# Patient Record
Sex: Female | Born: 1965 | Race: White | Hispanic: No | Marital: Single | Smoking: Former smoker
Health system: Southern US, Community
[De-identification: ages and names within clinical notes are randomized; demographics above are authoritative.]

## PROBLEM LIST (undated history)

## (undated) DIAGNOSIS — N950 Postmenopausal bleeding: Secondary | ICD-10-CM

## (undated) DIAGNOSIS — Z8719 Personal history of other diseases of the digestive system: Secondary | ICD-10-CM

## (undated) DIAGNOSIS — E669 Obesity, unspecified: Secondary | ICD-10-CM

## (undated) DIAGNOSIS — I10 Essential (primary) hypertension: Secondary | ICD-10-CM

## (undated) DIAGNOSIS — E119 Type 2 diabetes mellitus without complications: Secondary | ICD-10-CM

## (undated) DIAGNOSIS — E782 Mixed hyperlipidemia: Secondary | ICD-10-CM

## (undated) DIAGNOSIS — G43909 Migraine, unspecified, not intractable, without status migrainosus: Secondary | ICD-10-CM

---

## 1996-12-23 HISTORY — PX: CHOLECYSTECTOMY: SHX55

## 2004-12-23 HISTORY — PX: FRACTURE SURGERY: SHX138

## 2016-08-12 ENCOUNTER — Emergency Department
Admission: EM | Admit: 2016-08-12 | Discharge: 2016-08-12 | Disposition: A | Discharge status: Home / Self Care | Payer: Self-pay | Attending: Emergency Medicine | Admitting: Emergency Medicine

## 2016-08-12 ENCOUNTER — Emergency Department: Payer: Self-pay

## 2016-08-12 DIAGNOSIS — S61210A Laceration without foreign body of right index finger without damage to nail, initial encounter: Secondary | ICD-10-CM | POA: Insufficient documentation

## 2016-08-12 DIAGNOSIS — Z23 Encounter for immunization: Secondary | ICD-10-CM | POA: Insufficient documentation

## 2016-08-12 DIAGNOSIS — Y9389 Activity, other specified: Secondary | ICD-10-CM | POA: Insufficient documentation

## 2016-08-12 DIAGNOSIS — Y999 Unspecified external cause status: Secondary | ICD-10-CM | POA: Insufficient documentation

## 2016-08-12 DIAGNOSIS — W28XXXA Contact with powered lawn mower, initial encounter: Secondary | ICD-10-CM | POA: Insufficient documentation

## 2016-08-12 DIAGNOSIS — Y929 Unspecified place or not applicable: Secondary | ICD-10-CM | POA: Insufficient documentation

## 2016-08-12 DIAGNOSIS — S61219A Laceration without foreign body of unspecified finger without damage to nail, initial encounter: Secondary | ICD-10-CM

## 2016-08-12 MED ORDER — LIDOCAINE HCL (PF) 1 % IJ SOLN
INTRAMUSCULAR | Status: AC
  Administered 2016-08-12: 21:00:00
  Filled 2016-08-12: qty 5

## 2016-08-12 MED ORDER — BACITRACIN ZINC 500 UNIT/GM EX OINT
TOPICAL_OINTMENT | CUTANEOUS | Status: AC
  Filled 2016-08-12: qty 0.9

## 2016-08-12 MED ORDER — ONDANSETRON 8 MG PO TBDP
8.0000 mg | ORAL_TABLET | Freq: Once | ORAL | Status: AC
  Administered 2016-08-12: 8 mg via ORAL

## 2016-08-12 MED ORDER — TETANUS-DIPHTH-ACELL PERTUSSIS 5-2.5-18.5 LF-MCG/0.5 IM SUSP
0.5000 mL | Freq: Once | INTRAMUSCULAR | Status: AC
  Administered 2016-08-12: 0.5 mL via INTRAMUSCULAR
  Filled 2016-08-12: qty 0.5

## 2016-08-12 MED ORDER — OXYCODONE-ACETAMINOPHEN 5-325 MG PO TABS
2.0000 | ORAL_TABLET | Freq: Once | ORAL | Status: AC
  Administered 2016-08-12: 2 via ORAL
  Filled 2016-08-12: qty 2

## 2016-08-12 MED ORDER — ONDANSETRON 8 MG PO TBDP
ORAL_TABLET | ORAL | Status: AC
  Filled 2016-08-12: qty 1

## 2016-08-12 MED ORDER — OXYCODONE-ACETAMINOPHEN 7.5-325 MG PO TABS: 1.0000 | ORAL_TABLET | ORAL | 0 refills | Status: AC | PRN

## 2016-08-12 MED ORDER — BACITRACIN ZINC 500 UNIT/GM EX OINT
TOPICAL_OINTMENT | Freq: Two times a day (BID) | CUTANEOUS | Status: DC
  Administered 2016-08-12: 22:00:00 via TOPICAL
  Administered 2016-08-12: 1 via TOPICAL

## 2016-08-12 NOTE — ED Triage Notes (Signed)
Patient ambulatory to triage with steady gait, without difficulty or distress noted; pt reports injuring right index finger after catching it on lawn mower

## 2016-08-12 NOTE — ED Provider Notes (Signed)
[facility] Emergency Department Provider Note   ____________________________________________   First MD Initiated Contact with Patient 08/12/16 2037     (approximate)  I have reviewed the triage vital signs and the nursing notes.   HISTORY  Chief Complaint Finger Injury    HPI Vanessa Henderson is a 50 y.o. female patient complaining of a laceration and contusion to the right index finger. Status post trying to start a lawnmower. Patient rating the pain as a 9/10. No palliative measures taken for this complaint. Patient right hand dominant.   No past medical history on file.  There are no active problems to display for this patient.   No past surgical history on file.  Prior to Admission medications   Medication Sig Start Date End Date Taking? Authorizing Provider  oxyCODONE-acetaminophen (PERCOCET) 7.5-325 MG tablet Take 1 tablet by mouth every 4 (four) hours as needed for severe pain. 08/12/16 08/12/17  Joni Reiningonald K Katheline Brendlinger, PA-C    Allergies Latex and Vicodin [hydrocodone-acetaminophen]  No family history on file.  Social History Social History  Substance Use Topics  . Smoking status: Not on file  . Smokeless tobacco: Not on file  . Alcohol use Not on file    Review of Systems Constitutional: No fever/chills Eyes: No visual changes. ENT: No sore throat. Cardiovascular: Denies chest pain. Respiratory: Denies shortness of breath. Gastrointestinal: No abdominal pain.  No nausea, no vomiting.  No diarrhea.  No constipation. Genitourinary: Negative for dysuria. Musculoskeletal:Right index finger pain Skin: Negative for rash. Laceration right index finger Neurological: Negative for headaches, focal weakness or numbness. Allergic/Immunilogical: Hydrocodone  ____________________________________________   PHYSICAL EXAM:  VITAL SIGNS: ED Triage Vitals  Enc Vitals Group     BP 08/12/16 2006 (!) 149/94     Pulse Rate 08/12/16 2006 90   Resp 08/12/16 2006 (!) 22     Temp 08/12/16 2006 97.9 F (36.6 C)     Temp Source 08/12/16 2006 Oral     SpO2 08/12/16 2006 98 %     Weight 08/12/16 2005 200 lb (90.7 kg)     Height 08/12/16 2005 5\' 3"  (1.6 m)     Head Circumference --      Peak Flow --      Pain Score 08/12/16 2005 10     Pain Loc --      Pain Edu? --      Excl. in GC? --     Constitutional: Alert and oriented. Well appearing and in no acute distress. Anxious Eyes: Conjunctivae are normal. PERRL. EOMI. Head: Atraumatic. Nose: No congestion/rhinnorhea. Mouth/Throat: Mucous membranes are moist.  Oropharynx non-erythematous. Neck: No stridor.  No cervical spine tenderness to palpation. Hematological/Lymphatic/Immunilogical: No cervical lymphadenopathy. Cardiovascular: Normal rate, regular rhythm. Grossly normal heart sounds.  Good peripheral circulation. Respiratory: Normal respiratory effort.  No retractions. Lungs CTAB. Gastrointestinal: Soft and nontender. No distention. No abdominal bruits. No CVA tenderness. Musculoskeletal: No lower extremity tenderness nor edema.  No joint effusions. Neurologic:  Normal speech and language. No gross focal neurologic deficits are appreciated. No gait instability. Skin: Edema and laceration to the distal phalange palmar and volar aspect of the right index finger  Psychiatric: Mood and affect are normal. Speech and behavior are normal.  ____________________________________________   LABS (all labs ordered are listed, but only abnormal results are displayed)  Labs Reviewed - No data to display ____________________________________________  EKG   ____________________________________________  RADIOLOGY  Radiologist states questionable minimal displaced tuft fracture. ____________________________________________   PROCEDURES  Procedure(s) performed: LACERATION REPAIR Performed by: Joni Reiningonald K Idella Lamontagne Authorized by: Joni Reiningonald K Siddiq Kaluzny Consent: Verbal consent obtained. Risks  and benefits: risks, benefits and alternatives were discussed Consent given by: patient Patient identity confirmed: provided demographic data Prepped and Draped in normal sterile fashion Wound explored  Laceration Location: Right index finger  Laceration Length: 1 cm  No Foreign Bodies seen or palpated  Anesthesia: Digital block Local anesthetic: Lidocaine 1% without epinephrine  Anesthetic total: 7 ml  Irrigation method: syringe Amount of cleaning: standard  Skin closure: 4-0 nylon   Number of sutures: 6 Technique: Interrupted Patient tolerance: Patient tolerated the procedure well with no immediate complications.   Procedures  Critical Care performed: No  ____________________________________________   INITIAL IMPRESSION / ASSESSMENT AND PLAN / ED COURSE  Pertinent labs & imaging results that were available during my care of the patient were reviewed by me and considered in my medical decision making (see chart for details).  Contusion/laceration to the right index finger. Patient given discharge care instructions. Patient patient given tetanus booster was in the ED today. Patient given a prescription for Percocets to take as directed. Patient advised that his sutures removed in 10 days by this department or urgent care clinic.  Clinical Course     ____________________________________________   FINAL CLINICAL IMPRESSION(S) / ED DIAGNOSES  Final diagnoses:  Finger laceration, initial encounter      NEW MEDICATIONS STARTED DURING THIS VISIT:  New Prescriptions   OXYCODONE-ACETAMINOPHEN (PERCOCET) 7.5-325 MG TABLET    Take 1 tablet by mouth every 4 (four) hours as needed for severe pain.     Note:  This document was prepared using Dragon voice recognition software and may include unintentional dictation errors.    Joni ReiningRonald K Carolos Fecher, PA-C 08/12/16 2139    Jeanmarie PlantJames A McShane, MD 08/12/16 [phone]

## 2017-01-10 IMAGING — DX DG FINGER INDEX 2+V*R*
3 series · 3 of 3 positions shown · non-contrast
Comparison: None.

CLINICAL DATA: Lawnmower injury, lacerations.

EXAM:
RIGHT INDEX FINGER 2+V

[finger ap]
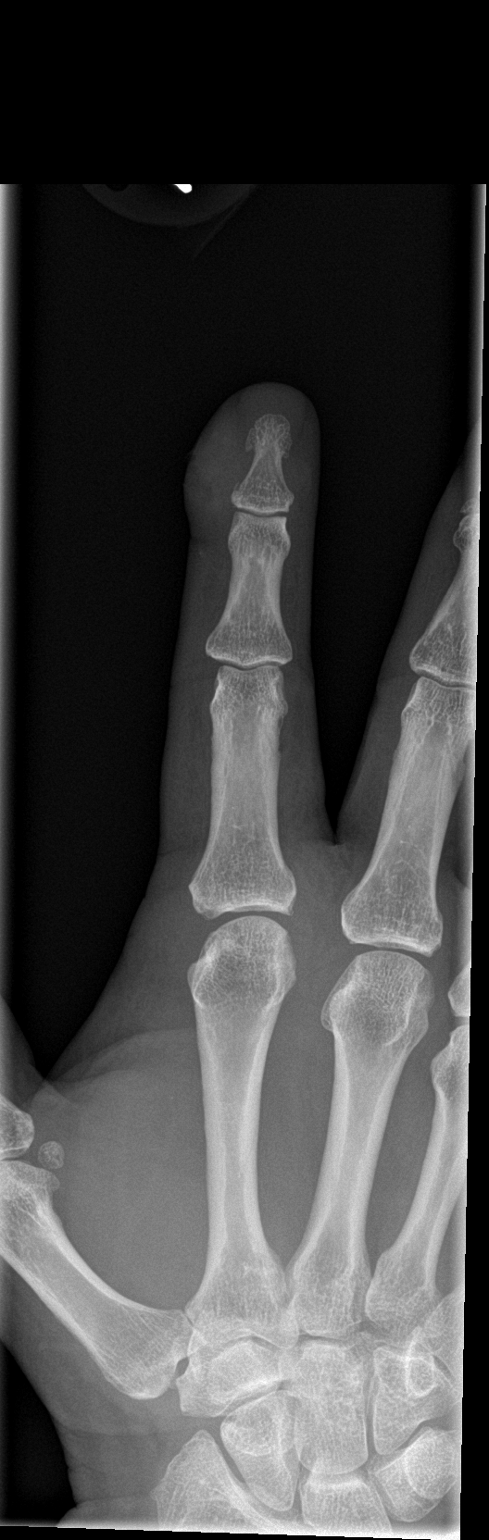

[finger obl]
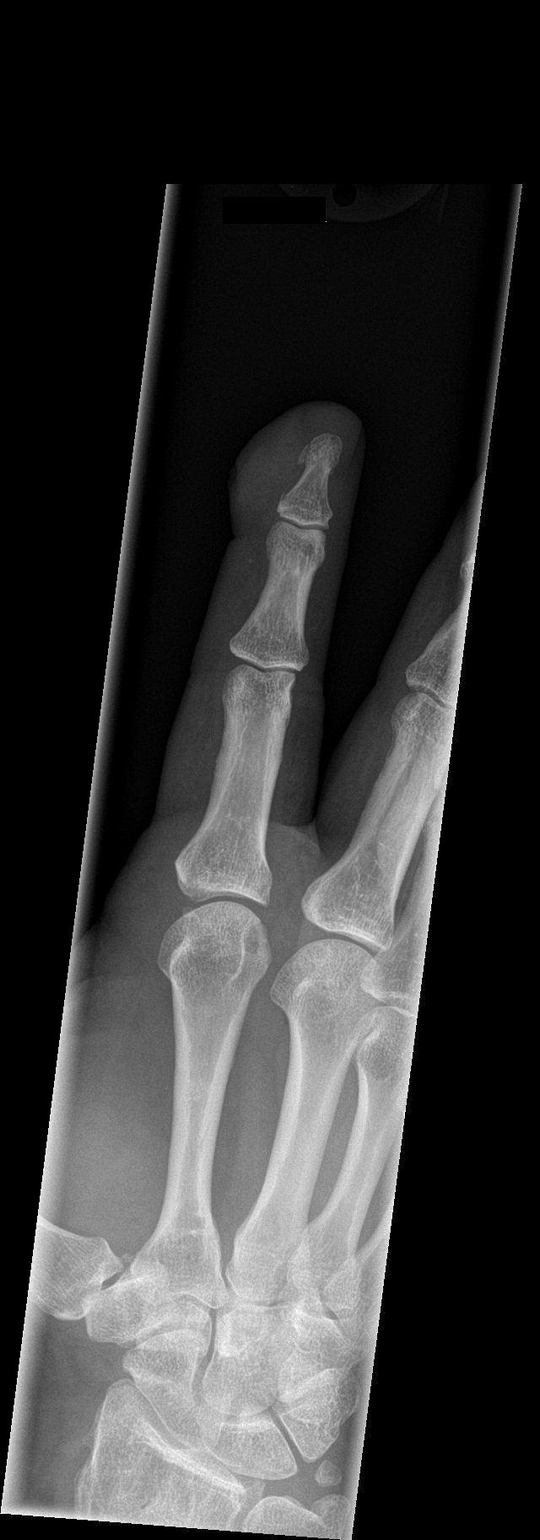

[finger lat]
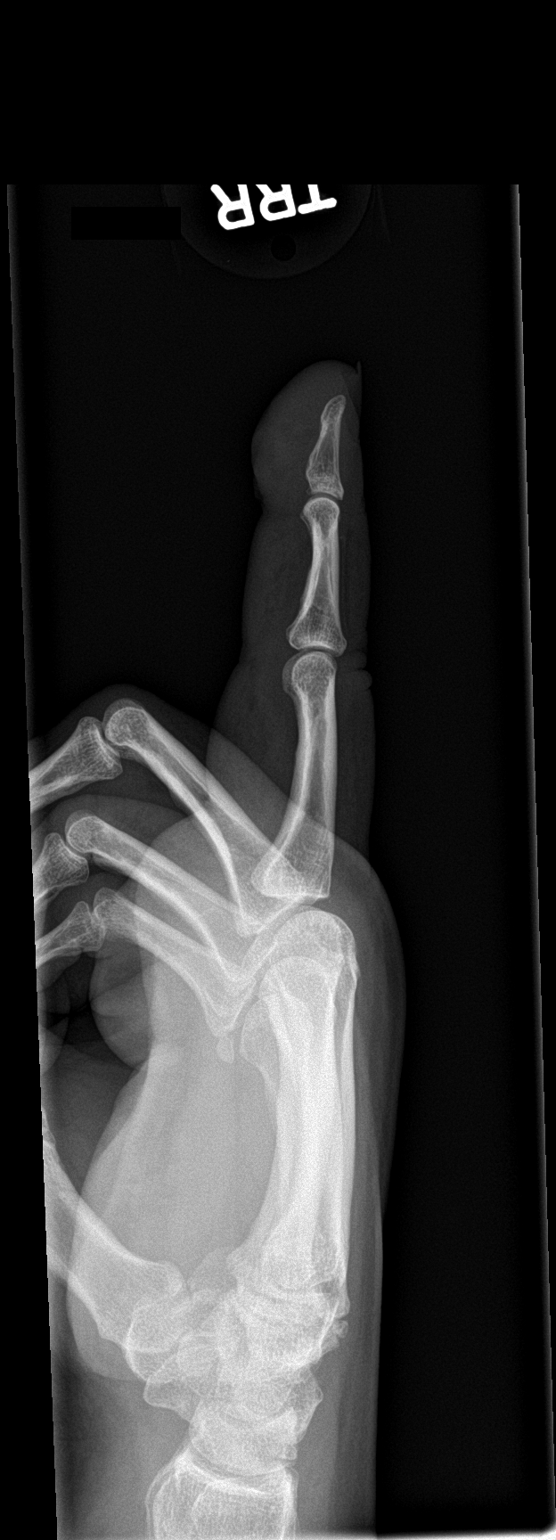

[3 of 3 positions shown; findings below may reference images not displayed]

FINDINGS: Soft tissue edema/laceration noted over the distal phalanx.
Questionable minimally displaced fracture along the lateral margin
of the tuft, but not convincing. Alignment at the DIP joint is
normal.
IMPRESSION: 1. Soft tissue edema/laceration overlying the distal phalanx of the
index finger.
2. Questionable minimally displaced fracture along the lateral
margin of the tuft, but not convincing.

## 2022-08-29 ENCOUNTER — Telehealth: Payer: Self-pay

## 2022-08-29 NOTE — Telephone Encounter (Signed)
Pt calling; at bathroom visit at lunch she had a bloody discharge with wiping; hasn't had a period in years; has not had a hyst; does not have a GYN.  Pt I could not adv her but will send to schedulers.  [phone]

## 2022-09-17 NOTE — Telephone Encounter (Signed)
Pt is scheduled 10/5th.

## 2022-11-18 ENCOUNTER — Other Ambulatory Visit: Payer: Self-pay

## 2022-11-22 ENCOUNTER — Other Ambulatory Visit: Payer: Self-pay

## 2022-12-18 ENCOUNTER — Other Ambulatory Visit: Payer: Self-pay | Admitting: Obstetrics and Gynecology

## 2022-12-19 NOTE — H&P (Signed)
Preoperative History and Physical  Vanessa Henderson is a 56 y.o. G0P0000 here for surgical management of postmenopausal bleeding.   No significant preoperative concerns.  History of Present Illness: 56  y.o. G0P0000 female who presents with one episode of vaginal bleeding on 08/29/2022.  She first noticed this when she wiped. She describes a light amount of bright red blood noted when she wiped.  She was at work. So, she stuffed toilet paper in the area. She changed this once due to a light amount of bleeding.  Later that day when she went home and she placed a pad and still had very light bleeding. The bleeding has been gone since then.   She had no other symptoms of abdominal pain, cramping. Denies unintentional weight loss, night sweats, new constipation, new bloating.    Her last period was in 2010.  She doesn't remember the last time she had a pap smear or mammogram.  She is not sexually active. Denies history of STIs.  She does have a history of external hemorrhoids.  She is sure the bleeding wasn't from this area.     Ultrasound from 10/22/2022: Ultrasound demonstrates the following findings Adnexa: no masses seen  Uterus: anteverted with endometrial stripe  9.6 mm Additional: fluid and echogenic material noted within the endometrial cavity and endocervical canal.  Cystic areas within the cervix, likely Nabothian cysts.   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   Current Outpatient Medications on File Prior to Visit  Medication Sig Dispense Refill   hydroCHLOROthiazide (HYDRODIURIL) 25 MG tablet TAKE 1 TABLET BY MOUTH EVERY DAY 90 tablet 1   ibuprofen (ADVIL,MOTRIN) 200 MG tablet Take 800 mg by mouth every 6 (six) hours as needed for Headache     losartan (COZAAR) 25 MG tablet Take 1 tablet (25 mg total) by mouth at bedtime 90 tablet 1   valACYclovir (VALTREX) 1000 MG tablet 2 tablets twice daily x 1 day 8 tablet 0   No current facility-administered medications on file prior to visit.   Allergies  Allergen Reactions   Adhesive Rash   Codeine Vomiting   Latex Rash and Angioedema    Causes rash, breaks out on mouth, and causes mouth tingling   Lovastatin Muscle Pain   Meloxicam Headache and Vomiting   Onion Headache    Migraine   Other Rash    Tapes (surgical, paper and Band-aid)   Tramadol Headache and Vomiting   Vicodin [Hydrocodone-Acetaminophen] Vomiting    Social History:   reports that she quit smoking about 30 years ago. Her smoking use included cigarettes. She has a 5 pack-year smoking history. She has been exposed to tobacco smoke. She has never used smokeless tobacco. She reports that she does not drink alcohol and does not use drugs.  Family History  Problem Relation Age of Onset  Stomach cancer Mother    High blood pressure (Hypertension) Mother    Hyperlipidemia (Elevated cholesterol) Mother    Diabetes type II Mother    Colon polyps Mother 20   Pancreatic cancer Father    Colon polyps Father 32   Depression Sister    Anxiety Sister    High blood pressure (Hypertension) Sister    Endometriosis Sister    Skin cancer Brother    Colon cancer Neg Hx     Review of Systems: Noncontributory  PHYSICAL EXAM: Blood pressure 131/82, pulse 86, weight (!) 103.8 kg (228 lb 12.8 oz). CONSTITUTIONAL: Well-developed, well-nourished female in no acute distress.  HENT:   Normocephalic, atraumatic, External right and left ear normal. Oropharynx is clear and moist EYES: Conjunctivae and EOM are normal. Pupils are equal, round, and reactive to light. No scleral icterus.  NECK: Normal range of motion, supple, no masses SKIN: Skin is warm and dry. No rash noted. Not diaphoretic. No erythema. No pallor. NEUROLGIC: Alert and oriented to person, place, and time. Normal reflexes, muscle tone coordination. No cranial nerve deficit noted. PSYCHIATRIC: Normal mood and affect. Normal behavior. Normal judgment and thought content. CARDIOVASCULAR: Normal heart rate noted, regular rhythm RESPIRATORY: Effort and breath sounds normal, no problems with respiration noted ABDOMEN: Soft, nontender, nondistended. PELVIC: Deferred MUSCULOSKELETAL: Normal range of motion. No edema and no tenderness. 2+ distal pulses.  Labs: No results found for this or any previous visit (from the past 336 hour(s)).  Imaging Studies: No results found.  Assessment: Patient Active Problem List  Diagnosis   PMB (postmenopausal bleeding)    Plan: Patient will undergo surgical management with the above-noted surgery.   The risks of surgery were discussed in detail with the patient including but not limited to: bleeding which may require transfusion or reoperation; infection which may require antibiotics; injury to surrounding organs which may involve bowel, bladder, ureters ; need for additional procedures including laparoscopy or laparotomy; thromboembolic phenomenon, surgical site problems and other postoperative/anesthesia complications. Likelihood of success in alleviating the patient's condition was discussed. Routine postoperative instructions will be reviewed with the patient and her family in detail after surgery.  The patient concurred with the proposed plan, giving informed written consent for the surgery. Preoperative prophylactic antibiotics, as indicated, and SCDs ordered on call to the OR.      Attestation Statement:   I personally performed the service. (TP)  Jozey Janco Teola Bradley, MD  [facility] OB/GYN [facility] 12/19/2022 4:00 PM

## 2022-12-26 ENCOUNTER — Encounter
Admission: RE | Admit: 2022-12-26 | Discharge: 2022-12-26 | Disposition: A | Discharge status: Home / Self Care | Payer: Commercial Managed Care - PPO | Source: Ambulatory Visit | Attending: Obstetrics and Gynecology | Admitting: Obstetrics and Gynecology

## 2022-12-26 VITALS — Ht 63.0 in | Wt 228.8 lb

## 2022-12-26 DIAGNOSIS — Z01812 Encounter for preprocedural laboratory examination: Secondary | ICD-10-CM

## 2022-12-26 DIAGNOSIS — I10 Essential (primary) hypertension: Secondary | ICD-10-CM

## 2022-12-26 DIAGNOSIS — E119 Type 2 diabetes mellitus without complications: Secondary | ICD-10-CM

## 2022-12-26 DIAGNOSIS — Z01818 Encounter for other preprocedural examination: Secondary | ICD-10-CM

## 2022-12-26 HISTORY — DX: Personal history of other diseases of the digestive system: Z87.19

## 2022-12-26 HISTORY — DX: Essential (primary) hypertension: I10

## 2022-12-26 HISTORY — DX: Type 2 diabetes mellitus without complications: E11.9

## 2022-12-26 HISTORY — DX: Migraine, unspecified, not intractable, without status migrainosus: G43.909

## 2022-12-26 HISTORY — DX: Mixed hyperlipidemia: E78.2

## 2022-12-26 HISTORY — DX: Postmenopausal bleeding: N95.0

## 2022-12-26 HISTORY — DX: Obesity, unspecified: E66.9

## 2022-12-26 NOTE — Patient Instructions (Signed)
Your procedure is scheduled on:01-03-23 Friday Report to the Registration Desk on the 1st floor of the [facility].Then proceed to the 2nd floor Surgery Desk To find out your arrival time, please call [phone] between 1PM - 3PM on:01-02-23 Thursday If your arrival time is 6:00 am, do not arrive prior to that time as the [facility] entrance doors do not open until 6:00 am.  REMEMBER: Instructions that are not followed completely may result in serious medical risk, up to and including death; or upon the discretion of your surgeon and anesthesiologist your surgery may need to be rescheduled.  Do not eat food after midnight the night before surgery.  No gum chewing, lozengers or hard candies.  You may however, drink Water up to 2 hours before you are scheduled to arrive for your surgery. Do not drink anything within 2 hours of your scheduled arrival time.  In addition, your doctor has ordered for you to drink the provided  Gatorade G2 Drinking this carbohydrate drink up to two hours before surgery helps to reduce insulin resistance and improve patient outcomes. Please complete drinking 2 hours prior to scheduled arrival time.  Do NOT take any medication the day of surgery  One week prior to surgery: Stop Anti-inflammatories (NSAIDS) such as Advil, Aleve, Ibuprofen, Motrin, Naproxen, Naprosyn and Aspirin based products such as Excedrin, Goodys Powder, BC Powder.You may however, take Tylenol if needed for pain up until the day of surgery.  Stop ANY OVER THE COUNTER supplements/vitamins NOW (12-26-22) until after surgery (Turmeric)  No Alcohol for 24 hours before or after surgery.  No Smoking including e-cigarettes for 24 hours prior to surgery.  No chewable tobacco products for at least 6 hours prior to surgery.  No nicotine patches on the day of surgery.  Do not use any "recreational" drugs for at least a week prior to your surgery.  Please be advised that the combination of cocaine  and anesthesia may have negative outcomes, up to and including death. If you test positive for cocaine, your surgery will be cancelled.  On the morning of surgery brush your teeth with toothpaste and water, you may rinse your mouth with mouthwash if you wish. Do not swallow any toothpaste or mouthwash.  Do not wear jewelry, make-up, hairpins, clips or nail polish.  Do not wear lotions, powders, or perfumes.   Do not shave body from the neck down 48 hours prior to surgery just in case you cut yourself which could leave a site for infection.  Also, freshly shaved skin may become irritated if using the CHG soap.  Contact lenses, hearing aids and dentures may not be worn into surgery.  Do not bring valuables to the hospital. [facility] is not responsible for any missing/lost belongings or valuables.   Notify your doctor if there is any change in your medical condition (cold, fever, infection).  Wear comfortable clothing (specific to your surgery type) to the hospital.  After surgery, you can help prevent lung complications by doing breathing exercises.  Take deep breaths and cough every 1-2 hours. Your doctor may order a device called an Incentive Spirometer to help you take deep breaths. When coughing or sneezing, hold a pillow firmly against your incision with both hands. This is called "splinting." Doing this helps protect your incision. It also decreases belly discomfort.  If you are being admitted to the hospital overnight, leave your suitcase in the car. After surgery it may be brought to your room.  If you  are being discharged the day of surgery, you will not be allowed to drive home. You will need a responsible adult (18 years or older) to drive you home and stay with you that night.   If you are taking public transportation, you will need to have a responsible adult (18 years or older) with you. Please confirm with your physician that it is acceptable to use public  transportation.   Please call the [facility]. at [phone] if you have any questions about these instructions.  Surgery Visitation Policy:  Patients undergoing a surgery or procedure may have two family members or support persons with them as long as the person is not COVID-19 positive or experiencing its symptoms.   Due to an increase in RSV and influenza rates and associated hospitalizations, children ages 47 and under will not be able to visit patients in [facility]. Masks continue to be strongly recommended.

## 2022-12-30 ENCOUNTER — Encounter
Admission: RE | Admit: 2022-12-30 | Discharge: 2022-12-30 | Disposition: A | Discharge status: Home / Self Care | Payer: Commercial Managed Care - PPO | Source: Ambulatory Visit | Attending: Obstetrics and Gynecology | Admitting: Obstetrics and Gynecology

## 2022-12-30 DIAGNOSIS — I10 Essential (primary) hypertension: Secondary | ICD-10-CM | POA: Diagnosis not present

## 2022-12-30 DIAGNOSIS — Z01818 Encounter for other preprocedural examination: Secondary | ICD-10-CM | POA: Insufficient documentation

## 2022-12-30 DIAGNOSIS — E119 Type 2 diabetes mellitus without complications: Secondary | ICD-10-CM

## 2022-12-30 DIAGNOSIS — E786 Lipoprotein deficiency: Secondary | ICD-10-CM | POA: Insufficient documentation

## 2022-12-30 DIAGNOSIS — I4510 Unspecified right bundle-branch block: Secondary | ICD-10-CM | POA: Insufficient documentation

## 2022-12-30 DIAGNOSIS — Z01812 Encounter for preprocedural laboratory examination: Secondary | ICD-10-CM

## 2022-12-30 DIAGNOSIS — I498 Other specified cardiac arrhythmias: Secondary | ICD-10-CM | POA: Insufficient documentation

## 2022-12-30 DIAGNOSIS — N950 Postmenopausal bleeding: Secondary | ICD-10-CM

## 2022-12-30 DIAGNOSIS — E876 Hypokalemia: Secondary | ICD-10-CM

## 2022-12-30 LAB — BASIC METABOLIC PANEL
Anion gap: 10 (ref 5–15)
BUN: 12 mg/dL (ref 6–20)
CO2: 29 mmol/L (ref 22–32)
Calcium: 9.4 mg/dL (ref 8.9–10.3)
Chloride: 98 mmol/L (ref 98–111)
Creatinine, Ser: 0.79 mg/dL (ref 0.44–1.00)
GFR, Estimated: >60 mL/min (ref >60)
Glucose, Bld: 154 mg/dL — ABNORMAL HIGH (ref 70–99)
Potassium: 3.1 mmol/L — ABNORMAL LOW (ref 3.5–5.1)
Sodium: 137 mmol/L (ref 135–145)

## 2022-12-30 LAB — TYPE AND SCREEN
ABO/RH(D): A POS
Antibody Screen: NEGATIVE

## 2022-12-30 NOTE — Progress Notes (Addendum)
  [facility] Perioperative Services: Pre-Admission/Anesthesia Testing  Abnormal Lab Notification   Date: 12/30/22  Name: Vanessa Henandez MRN:   [id number]  Re: Abnormal labs noted during PAT appointment   Notified:  Provider Name Provider Role Notification Mode  Prentice Docker, MD OB/GYN Routed and/or faxed via [facility] and Notes:  ABNORMAL LAB VALUE(S): Lab Results  Component Value Date   K 3.1 (L) 12/30/2022   Vanessa Henderson is scheduled for an elective DILATATION AND CURETTAGE /HYSTEROSCOPY  on 01/03/2022. In review of her medication reconciliation, it is noted that the patient is NOT taking any type of prescribed diuretic medications.   Please note, in efforts to promote a safe and effective anesthetic course, per current guidelines/standards set by the [facility] anesthesia team, the minimal acceptable K+ level for the patient to proceed with general anesthesia is 3.0 mmol/L. With that being said, if the patient drops any lower, her elective procedure will need to be postponed until K+ is better optimized. Abnormal result is being forwarded to primary attending surgeon for review and consideration of optimization.   Order placed to have K+ rechecked on the day of her procedure to ensure correction of the noted derangement.    Honor Loh, MSN, APRN, FNP-C, CEN [facility]  Peri-operative Services Nurse Practitioner Phone: [phone] Fax: [phone] 12/30/22 4:14 PM

## 2023-01-03 ENCOUNTER — Ambulatory Visit: Payer: Commercial Managed Care - PPO | Admitting: Urgent Care

## 2023-01-03 ENCOUNTER — Other Ambulatory Visit: Payer: Self-pay

## 2023-01-03 ENCOUNTER — Encounter
Admission: RE | Disposition: A | Discharge status: Home / Self Care | Payer: Self-pay | Source: Home / Self Care | Attending: Obstetrics and Gynecology

## 2023-01-03 ENCOUNTER — Ambulatory Visit
Admission: RE | Admit: 2023-01-03 | Discharge: 2023-01-03 | Disposition: A | Discharge status: Home / Self Care | Payer: Commercial Managed Care - PPO | Attending: Obstetrics and Gynecology | Admitting: Obstetrics and Gynecology

## 2023-01-03 ENCOUNTER — Encounter: Payer: Self-pay | Admitting: Obstetrics and Gynecology

## 2023-01-03 ENCOUNTER — Ambulatory Visit: Payer: Commercial Managed Care - PPO | Admitting: Certified Registered Nurse Anesthetist

## 2023-01-03 DIAGNOSIS — I10 Essential (primary) hypertension: Secondary | ICD-10-CM | POA: Insufficient documentation

## 2023-01-03 DIAGNOSIS — Z8719 Personal history of other diseases of the digestive system: Secondary | ICD-10-CM | POA: Insufficient documentation

## 2023-01-03 DIAGNOSIS — Z6841 Body Mass Index (BMI) 40.0 and over, adult: Secondary | ICD-10-CM | POA: Diagnosis not present

## 2023-01-03 DIAGNOSIS — E119 Type 2 diabetes mellitus without complications: Secondary | ICD-10-CM | POA: Insufficient documentation

## 2023-01-03 DIAGNOSIS — N950 Postmenopausal bleeding: Secondary | ICD-10-CM | POA: Insufficient documentation

## 2023-01-03 DIAGNOSIS — E876 Hypokalemia: Secondary | ICD-10-CM

## 2023-01-03 DIAGNOSIS — Z01812 Encounter for preprocedural laboratory examination: Secondary | ICD-10-CM

## 2023-01-03 DIAGNOSIS — Z7722 Contact with and (suspected) exposure to environmental tobacco smoke (acute) (chronic): Secondary | ICD-10-CM | POA: Insufficient documentation

## 2023-01-03 DIAGNOSIS — N840 Polyp of corpus uteri: Secondary | ICD-10-CM | POA: Diagnosis not present

## 2023-01-03 DIAGNOSIS — Z87891 Personal history of nicotine dependence: Secondary | ICD-10-CM | POA: Diagnosis not present

## 2023-01-03 DIAGNOSIS — Z01818 Encounter for other preprocedural examination: Secondary | ICD-10-CM

## 2023-01-03 HISTORY — PX: HYSTEROSCOPY WITH D & C: SHX1775

## 2023-01-03 LAB — POCT I-STAT, CHEM 8
BUN: 13 mg/dL (ref 6–20)
Calcium, Ion: 1.11 mmol/L — ABNORMAL LOW (ref 1.15–1.40)
Chloride: 103 mmol/L (ref 98–111)
Creatinine, Ser: 0.7 mg/dL (ref 0.44–1.00)
Glucose, Bld: 124 mg/dL — ABNORMAL HIGH (ref 70–99)
HCT: 45 % (ref 36.0–46.0)
Hemoglobin: 15.3 g/dL — ABNORMAL HIGH (ref 12.0–15.0)
Potassium: 3.6 mmol/L (ref 3.5–5.1)
Sodium: 139 mmol/L (ref 135–145)
TCO2: 24 mmol/L (ref 22–32)

## 2023-01-03 LAB — ABO/RH: ABO/RH(D): A POS

## 2023-01-03 LAB — GLUCOSE, CAPILLARY: Glucose-Capillary: 113 mg/dL — ABNORMAL HIGH (ref 70–99)

## 2023-01-03 SURGERY — DILATATION AND CURETTAGE /HYSTEROSCOPY
Anesthesia: General

## 2023-01-03 MED ORDER — LACTATED RINGERS IV SOLN: INTRAVENOUS | Status: DC

## 2023-01-03 MED ORDER — ACETAMINOPHEN 10 MG/ML IV SOLN
INTRAVENOUS | Status: DC | PRN
  Administered 2023-01-03: 1000 mg via INTRAVENOUS

## 2023-01-03 MED ORDER — PROPOFOL 1000 MG/100ML IV EMUL
INTRAVENOUS | Status: AC
  Filled 2023-01-03: qty 100

## 2023-01-03 MED ORDER — ONDANSETRON HCL 4 MG/2ML IJ SOLN
INTRAMUSCULAR | Status: DC | PRN
  Administered 2023-01-03: 4 mg via INTRAVENOUS

## 2023-01-03 MED ORDER — SILVER NITRATE-POT NITRATE 75-25 % EX MISC
CUTANEOUS | Status: DC | PRN
  Administered 2023-01-03: 2

## 2023-01-03 MED ORDER — ALBUTEROL SULFATE HFA 108 (90 BASE) MCG/ACT IN AERS
INHALATION_SPRAY | RESPIRATORY_TRACT | Status: DC | PRN
  Administered 2023-01-03: 5 via RESPIRATORY_TRACT

## 2023-01-03 MED ORDER — SUCCINYLCHOLINE CHLORIDE 200 MG/10ML IV SOSY
PREFILLED_SYRINGE | INTRAVENOUS | Status: DC | PRN
  Administered 2023-01-03: 140 mg via INTRAVENOUS

## 2023-01-03 MED ORDER — ONDANSETRON HCL 4 MG/2ML IJ SOLN: 4.0000 mg | Freq: Once | INTRAMUSCULAR | Status: DC | PRN

## 2023-01-03 MED ORDER — DEXAMETHASONE SODIUM PHOSPHATE 10 MG/ML IJ SOLN
INTRAMUSCULAR | Status: AC
  Filled 2023-01-03: qty 1

## 2023-01-03 MED ORDER — CHLORHEXIDINE GLUCONATE 0.12 % MT SOLN
OROMUCOSAL | Status: AC
  Administered 2023-01-03: 15 mL via OROMUCOSAL
  Filled 2023-01-03: qty 15

## 2023-01-03 MED ORDER — PROPOFOL 10 MG/ML IV BOLUS
INTRAVENOUS | Status: DC | PRN
  Administered 2023-01-03: 150 mg via INTRAVENOUS
  Administered 2023-01-03: 25 mg via INTRAVENOUS

## 2023-01-03 MED ORDER — SODIUM CHLORIDE 0.9 % IR SOLN
Status: DC | PRN
  Administered 2023-01-03: 3000 mL

## 2023-01-03 MED ORDER — MIDAZOLAM HCL 2 MG/2ML IJ SOLN
INTRAMUSCULAR | Status: AC
  Filled 2023-01-03: qty 2

## 2023-01-03 MED ORDER — LIDOCAINE HCL (CARDIAC) PF 100 MG/5ML IV SOSY
PREFILLED_SYRINGE | INTRAVENOUS | Status: DC | PRN
  Administered 2023-01-03: 80 mg via INTRAVENOUS

## 2023-01-03 MED ORDER — ACETAMINOPHEN 10 MG/ML IV SOLN
INTRAVENOUS | Status: AC
  Filled 2023-01-03: qty 100

## 2023-01-03 MED ORDER — ORAL CARE MOUTH RINSE: 15.0000 mL | Freq: Once | OROMUCOSAL | Status: AC

## 2023-01-03 MED ORDER — DEXMEDETOMIDINE HCL IN NACL 80 MCG/20ML IV SOLN
INTRAVENOUS | Status: DC | PRN
  Administered 2023-01-03 (×2): 8 ug via BUCCAL

## 2023-01-03 MED ORDER — DIPHENHYDRAMINE HCL 50 MG/ML IJ SOLN
INTRAMUSCULAR | Status: DC | PRN
  Administered 2023-01-03: 12.5 mg via INTRAVENOUS

## 2023-01-03 MED ORDER — EPHEDRINE SULFATE (PRESSORS) 50 MG/ML IJ SOLN
INTRAMUSCULAR | Status: DC | PRN
  Administered 2023-01-03: 5 mg via INTRAVENOUS

## 2023-01-03 MED ORDER — FENTANYL CITRATE (PF) 100 MCG/2ML IJ SOLN
INTRAMUSCULAR | Status: AC
  Administered 2023-01-03: 25 ug via INTRAVENOUS
  Filled 2023-01-03: qty 2

## 2023-01-03 MED ORDER — MIDAZOLAM HCL 2 MG/2ML IJ SOLN
INTRAMUSCULAR | Status: DC | PRN
  Administered 2023-01-03: 1 mg via INTRAVENOUS

## 2023-01-03 MED ORDER — CHLORHEXIDINE GLUCONATE 0.12 % MT SOLN: 15.0000 mL | Freq: Once | OROMUCOSAL | Status: AC

## 2023-01-03 MED ORDER — SILVER NITRATE-POT NITRATE 75-25 % EX MISC
CUTANEOUS | Status: AC
  Filled 2023-01-03: qty 10

## 2023-01-03 MED ORDER — FENTANYL CITRATE (PF) 100 MCG/2ML IJ SOLN
INTRAMUSCULAR | Status: AC
  Filled 2023-01-03: qty 2

## 2023-01-03 MED ORDER — FENTANYL CITRATE (PF) 100 MCG/2ML IJ SOLN
INTRAMUSCULAR | Status: DC | PRN
  Administered 2023-01-03: 50 ug via INTRAVENOUS

## 2023-01-03 MED ORDER — DEXAMETHASONE SODIUM PHOSPHATE 10 MG/ML IJ SOLN
INTRAMUSCULAR | Status: DC | PRN
  Administered 2023-01-03: 8 mg via INTRAVENOUS

## 2023-01-03 MED ORDER — FENTANYL CITRATE (PF) 100 MCG/2ML IJ SOLN: 25.0000 ug | INTRAMUSCULAR | Status: DC | PRN

## 2023-01-03 MED ORDER — ONDANSETRON HCL 4 MG/2ML IJ SOLN
INTRAMUSCULAR | Status: AC
  Filled 2023-01-03: qty 2

## 2023-01-03 MED ORDER — SODIUM CHLORIDE 0.9 % IV SOLN: INTRAVENOUS | Status: DC

## 2023-01-03 SURGICAL SUPPLY — 28 items
BAG DRN RND TRDRP ANRFLXCHMBR (UROLOGICAL SUPPLIES)
BAG URINE DRAIN 2000ML AR STRL (UROLOGICAL SUPPLIES) IMPLANT
CATH FOLEY 2WAY  5CC 16FR (CATHETERS)
CATH FOLEY 2WAY 5CC 16FR (CATHETERS)
CATH URTH 16FR FL 2W BLN LF (CATHETERS) IMPLANT
DEVICE MYOSURE LITE (MISCELLANEOUS) ×1 IMPLANT
DEVICE MYOSURE REACH (MISCELLANEOUS) ×1 IMPLANT
DRSG TELFA 3X8 NADH STRL (GAUZE/BANDAGES/DRESSINGS) IMPLANT
ELECT REM PT RETURN 9FT ADLT (ELECTROSURGICAL)
ELECTRODE REM PT RTRN 9FT ADLT (ELECTROSURGICAL) ×1 IMPLANT
GLOVE BIO SURGEON STRL SZ7 (GLOVE) ×1 IMPLANT
GLOVE BIOGEL PI IND STRL 7.5 (GLOVE) ×1 IMPLANT
GOWN STRL REUS W/ TWL LRG LVL3 (GOWN DISPOSABLE) ×2 IMPLANT
GOWN STRL REUS W/TWL LRG LVL3 (GOWN DISPOSABLE) ×2
IV NS IRRIG 3000ML ARTHROMATIC (IV SOLUTION) ×1 IMPLANT
KIT PROCEDURE FLUENT (KITS) ×1 IMPLANT
KIT TURNOVER CYSTO (KITS) ×1 IMPLANT
MANIFOLD NEPTUNE II (INSTRUMENTS) ×1 IMPLANT
PACK DNC HYST (MISCELLANEOUS) ×1 IMPLANT
PAD OB MATERNITY 4.3X12.25 (PERSONAL CARE ITEMS) ×1 IMPLANT
PAD PREP 24X41 OB/GYN DISP (PERSONAL CARE ITEMS) ×1 IMPLANT
SCRUB CHG 4% DYNA-HEX 4OZ (MISCELLANEOUS) ×1 IMPLANT
SEAL ROD LENS SCOPE MYOSURE (ABLATOR) ×1 IMPLANT
SET CYSTO W/LG BORE CLAMP LF (SET/KITS/TRAYS/PACK) IMPLANT
SURGILUBE 2OZ TUBE FLIPTOP (MISCELLANEOUS) ×1 IMPLANT
TRAP FLUID SMOKE EVACUATOR (MISCELLANEOUS) ×1 IMPLANT
TUBING CONNECTING 10 (TUBING) ×1 IMPLANT
WATER STERILE IRR 500ML POUR (IV SOLUTION) ×1 IMPLANT

## 2023-01-03 NOTE — Transfer of Care (Signed)
Immediate Anesthesia Transfer of Care Note  Patient: Vanessa Henderson  Procedure(s) Performed: DILATATION AND CURETTAGE /HYSTEROSCOPY, ENDOMETRIAL POLPECTOMY  Patient Location: PACU  Anesthesia Type:General  Level of Consciousness: awake and drowsy  Airway & Oxygen Therapy: Patient Spontanous Breathing and Patient connected to face mask oxygen  Post-op Assessment: Report given to RN and Post -op Vital signs reviewed and stable  Post vital signs: Reviewed  Last Vitals:  Vitals Value Taken Time  BP 131/95 01/03/23 0839  Temp    Pulse 71 01/03/23 0839  Resp 18 01/03/23 0839  SpO2 100 % 01/03/23 0839  Vitals shown include unvalidated device data.  Last Pain:  Vitals:   01/03/23 4709  PainSc: 0-No pain         Complications: No notable events documented.

## 2023-01-03 NOTE — Op Note (Signed)
Operative Note   Date of Service: 01/03/2023  DOB: 1966/04/19  MRN: [id number]   PRE-OP DIAGNOSIS: Postmenopausal bleeding   POST-OP DIAGNOSIS:  1) Postmenopausal bleeding  2) Endometrial polyp  SURGEON: Surgeon(s) and Role:    Will Bonnet, MD - Primary  PROCEDURE: Procedure(s): 1) DILATATION AND CURETTAGE /HYSTEROSCOPY  2) Endometrial polypectomy  ANESTHESIA: Choice   ESTIMATED BLOOD LOSS: 5 mL  DRAINS: none   TOTAL IV FLUIDS: 600 mL crystalloid  SPECIMENS:  1) Endometrial curettings 2) Endometrial polyp  VTE PROPHYLAXIS: SCDs to the bilateral lower extremities  ANTIBIOTICS: None indicated  FLUID DEFICIT: minimal (zero on Fluent system)  COMPLICATIONS: none  DISPOSITION: PACU - hemodynamically stable.  CONDITION: stable  INDICATION: 57 y.o. female with postmenopausal bleeding and a thickened endometrial stripe. She was intolerant of pelvic exams in the clinic. She presented for biopsy and assessment.  FINDINGS: Exam under anesthesia revealed small, mobile anteverted uterus.  The bilateral adnexa without masses or fullness. Hysteroscopy revealed a with a broad-based polypoid lesion on the left and anterior walls with bilateral tubal ostia and normal appearing endocervical canal.  PROCEDURE IN DETAIL:  After informed consent was obtained, the patient was taken to the operating room where anesthesia was obtained without difficulty. The patient was positioned in the dorsal lithotomy position in [place].  The patient's bladder was catheterized with an in and out foley catheter.  The patient was examined under anesthesia, with the above noted findings.  The bi-valved speculum was placed inside the patient's vagina, and the the anterior lip of the cervix was grasped with the tenaculum.  Then the cervix was progressively dilated to a 7 mm Hegar dilator.  The hysteroscope was introduced, with the above noted findings.  The MyoSure Reach device was utilized to  remove the polypoid lesion and partly sample the uterus, especially around the ostia.   The hystersocope was removed and the uterine cavity was curetted until a gritty texture was noted, yielding moderate endometrial curettings.  The hysteroscope was re-inserted and hemostasis was not and apparent global sampling obtained.  The hysteroscope was removed. Hemostasis was noted at the cervix after application of silver nitrate at the tenaculum entry sites. All instruments were removed.  She was then taken out of dorsal lithotomy.  The patient tolerated the procedure well.  Sponge, lap and needle counts were correct x2.  The patient was taken to recovery room in excellent condition.  Will Bonnet, MD, [place] 01/03/2023 8:25 AM

## 2023-01-03 NOTE — Anesthesia Preprocedure Evaluation (Signed)
Anesthesia Evaluation  Patient identified by MRN, date of birth, ID band Patient awake    Reviewed: Allergy & Precautions, H&P , NPO status , Patient's Chart, lab work & pertinent test results, reviewed documented beta blocker date and time   Airway Mallampati: II  TM Distance: >3 FB Neck ROM: full    Dental  (+) Teeth Intact   Pulmonary neg pulmonary ROS, former smoker   Pulmonary exam normal        Cardiovascular Exercise Tolerance: Good hypertension, On Medications (-) angina (-) CAD and (-) Past MI negative cardio ROS Normal cardiovascular exam Rhythm:regular Rate:Normal     Neuro/Psych  Headaches  negative psych ROS   GI/Hepatic Neg liver ROS, hiatal hernia,,,  Endo/Other  diabetes, Well Controlled  Morbid obesity  Renal/GU negative Renal ROS  negative genitourinary   Musculoskeletal   Abdominal   Peds  Hematology negative hematology ROS (+)   Anesthesia Other Findings Past Medical History: No date: DM (diabetes mellitus), type 2 (HCC)     Comment:  diet controlled No date: History of hiatal hernia No date: Hypertension No date: Migraines No date: Mixed hyperlipidemia No date: Obesity No date: PMB (postmenopausal bleeding) Past Surgical History: 1998: CHOLECYSTECTOMY 2006: FRACTURE SURGERY; Right     Comment:  wrist BMI    Body Mass Index: 40.39 kg/m     Reproductive/Obstetrics negative OB ROS                             Anesthesia Physical Anesthesia Plan  ASA: 3  Anesthesia Plan: General ETT   Post-op Pain Management:    Induction:   PONV Risk Score and Plan: 4 or greater  Airway Management Planned:   Additional Equipment:   Intra-op Plan:   Post-operative Plan:   Informed Consent: I have reviewed the patients History and Physical, chart, labs and discussed the procedure including the risks, benefits and alternatives for the proposed anesthesia with the  patient or authorized representative who has indicated his/her understanding and acceptance.     Dental Advisory Given  Plan Discussed with: CRNA  Anesthesia Plan Comments:        Anesthesia Quick Evaluation

## 2023-01-03 NOTE — Discharge Instructions (Signed)

## 2023-01-03 NOTE — Anesthesia Procedure Notes (Signed)
Procedure Name: Intubation Date/Time: 01/03/2023 7:44 AM  Performed by: Johnna Acosta, CRNAPre-anesthesia Checklist: Patient identified, Emergency Drugs available, Suction available, Patient being monitored and Timeout performed Patient Re-evaluated:Patient Re-evaluated prior to induction Oxygen Delivery Method: Circle system utilized Preoxygenation: Pre-oxygenation with 100% oxygen Induction Type: IV induction and Rapid sequence Laryngoscope Size: McGraph and 3 Grade View: Grade III Tube type: Oral Number of attempts: 1 Airway Equipment and Method: Stylet and Video-laryngoscopy Placement Confirmation: ETT inserted through vocal cords under direct vision, positive ETCO2 and breath sounds checked- equal and bilateral Secured at: 20 cm Tube secured with: Tape Dental Injury: Teeth and Oropharynx as per pre-operative assessment

## 2023-01-03 NOTE — Interval H&P Note (Signed)
History and Physical Interval Note:  01/03/2023 7:20 AM  Vanessa Henderson  has presented today for surgery, with the diagnosis of postmenopausal bleeding.  The various methods of treatment have been discussed with the patient and family. After consideration of risks, benefits and other options for treatment, the patient has consented to  Procedure(s): DILATATION AND CURETTAGE /HYSTEROSCOPY (N/A) as a surgical intervention.  The patient's history has been reviewed, patient examined, no change in status, stable for surgery.  I have reviewed the patient's chart and labs.  Questions were answered to the patient's satisfaction.    Prentice Docker, MD, [facility] 01/03/2023 7:20 AM

## 2023-01-06 LAB — SURGICAL PATHOLOGY

## 2023-01-09 NOTE — Anesthesia Postprocedure Evaluation (Signed)
Anesthesia Post Note  Patient: Tanysha Quant  Procedure(s) Performed: DILATATION AND CURETTAGE /HYSTEROSCOPY, ENDOMETRIAL POLPECTOMY  Patient location during evaluation: PACU Anesthesia Type: General Level of consciousness: awake and alert Pain management: pain level controlled Vital Signs Assessment: post-procedure vital signs reviewed and stable Respiratory status: spontaneous breathing, nonlabored ventilation, respiratory function stable and patient connected to nasal cannula oxygen Cardiovascular status: blood pressure returned to baseline and stable Postop Assessment: no apparent nausea or vomiting Anesthetic complications: no   No notable events documented.   Last Vitals:  Vitals:   01/03/23 0930 01/03/23 0943  BP: 123/84 131/86  Pulse: 73 73  Resp: 14 15  Temp: 36.4 C (!) 36.3 C  SpO2: 97% 94%    Last Pain:  Vitals:   01/06/23 0930  TempSrc:   PainSc: 3                  Martha Clan
